# Patient Record
Sex: Female | Born: 1960 | Race: White | Hispanic: No | Marital: Married | Smoking: Never smoker
Health system: Southern US, Community
[De-identification: ages and names within clinical notes are randomized; demographics above are authoritative.]

## PROBLEM LIST (undated history)

## (undated) DIAGNOSIS — I639 Cerebral infarction, unspecified: Secondary | ICD-10-CM

## (undated) DIAGNOSIS — N898 Other specified noninflammatory disorders of vagina: Secondary | ICD-10-CM

## (undated) DIAGNOSIS — M1611 Unilateral primary osteoarthritis, right hip: Secondary | ICD-10-CM

## (undated) HISTORY — DX: Unilateral primary osteoarthritis, right hip: M16.11

## (undated) HISTORY — DX: Cerebral infarction, unspecified: I63.9

## (undated) HISTORY — DX: Other specified noninflammatory disorders of vagina: N89.8

## (undated) HISTORY — PX: NO PAST SURGERIES: SHX2092

---

## 2009-10-12 ENCOUNTER — Ambulatory Visit: Payer: Self-pay | Admitting: Obstetrics and Gynecology

## 2009-11-09 ENCOUNTER — Ambulatory Visit: Payer: Self-pay | Admitting: Obstetrics and Gynecology

## 2010-07-09 ENCOUNTER — Emergency Department: Payer: Self-pay | Admitting: Emergency Medicine

## 2011-03-29 ENCOUNTER — Ambulatory Visit: Payer: Self-pay | Admitting: Obstetrics and Gynecology

## 2012-06-12 ENCOUNTER — Ambulatory Visit: Payer: Self-pay | Admitting: Family Medicine

## 2012-07-31 ENCOUNTER — Ambulatory Visit: Payer: Self-pay | Admitting: Gastroenterology

## 2013-08-26 ENCOUNTER — Ambulatory Visit: Payer: Self-pay | Admitting: Oncology

## 2013-09-16 ENCOUNTER — Ambulatory Visit: Payer: Self-pay | Admitting: Oncology

## 2015-10-13 ENCOUNTER — Other Ambulatory Visit: Payer: Self-pay | Admitting: Obstetrics and Gynecology

## 2015-10-13 DIAGNOSIS — Z1231 Encounter for screening mammogram for malignant neoplasm of breast: Secondary | ICD-10-CM

## 2015-12-15 ENCOUNTER — Ambulatory Visit
Admission: RE | Admit: 2015-12-15 | Discharge: 2015-12-15 | Disposition: A | Discharge status: Home / Self Care | Source: Ambulatory Visit | Attending: Obstetrics and Gynecology | Admitting: Obstetrics and Gynecology

## 2015-12-15 DIAGNOSIS — Z1231 Encounter for screening mammogram for malignant neoplasm of breast: Secondary | ICD-10-CM | POA: Insufficient documentation

## 2016-12-22 ENCOUNTER — Ambulatory Visit (INDEPENDENT_AMBULATORY_CARE_PROVIDER_SITE_OTHER): Admitting: Obstetrics and Gynecology

## 2016-12-22 ENCOUNTER — Encounter: Payer: Self-pay | Admitting: Obstetrics and Gynecology

## 2016-12-22 VITALS — BP 124/74 | Ht 63.0 in | Wt 146.0 lb

## 2016-12-22 DIAGNOSIS — Z124 Encounter for screening for malignant neoplasm of cervix: Secondary | ICD-10-CM

## 2016-12-22 DIAGNOSIS — Z1389 Encounter for screening for other disorder: Secondary | ICD-10-CM | POA: Diagnosis not present

## 2016-12-22 DIAGNOSIS — Z01419 Encounter for gynecological examination (general) (routine) without abnormal findings: Secondary | ICD-10-CM | POA: Diagnosis not present

## 2016-12-22 DIAGNOSIS — N952 Postmenopausal atrophic vaginitis: Secondary | ICD-10-CM | POA: Diagnosis not present

## 2016-12-22 DIAGNOSIS — Z1331 Encounter for screening for depression: Secondary | ICD-10-CM

## 2016-12-22 DIAGNOSIS — Z1339 Encounter for screening examination for other mental health and behavioral disorders: Secondary | ICD-10-CM

## 2016-12-22 LAB — HM PAP SMEAR: HM Pap smear: NORMAL

## 2016-12-22 MED ORDER — ESTROGENS, CONJUGATED 0.625 MG/GM VA CREA: 0.5000 g | TOPICAL_CREAM | VAGINAL | 3 refills | Status: DC

## 2016-12-22 NOTE — Progress Notes (Signed)
Routine Annual Gynecology Examination   PCP: Cheryll Dessert, FNP  Chief Complaint:  Chief Complaint  Patient presents with  . Annual Exam    History of Present Illness: Patient is a 56 y.o. G1P1001 presents for annual exam. The patient has no complaints today.   Menopausal bleeding: denies  Menopausal symptoms: denies. She is doing very well with Premarin and would like to continue  Breast symptoms: denies  Last pap smear: 3 years ago.  Result Normal  Last mammogram: 1 years ago.  Result Normal  Past Medical History:  Diagnosis Date  . Stroke (HCC)   . Vaginal dryness    Past Surgical History: denies  Prior to Admission medications   Medication Sig Start Date End Date Taking? Authorizing Provider  cholecalciferol (VITAMIN D) 1000 units tablet Take 1,000 Units by mouth daily.   Yes [provider]  conjugated estrogens (PREMARIN) vaginal cream Place 1 Applicatorful vaginally daily.   Yes [provider]  vitamin B-12 (CYANOCOBALAMIN) 100 MCG tablet Take 100 mcg by mouth daily.   Yes [provider]    No Known Allergies  Gynecologic History:  No LMP recorded. Patient is postmenopausal. Contraception: none  Obstetric History: G1P1001  Social History   Social History  . Marital status: Married    Spouse name: N/A  . Number of children: N/A  . Years of education: N/A   Occupational History  . Not on file.   Social History Main Topics  . Smoking status: Never Smoker  . Smokeless tobacco: Never Used  . Alcohol use Yes  . Drug use: No  . Sexual activity: Not on file   Other Topics Concern  . Not on file   Social History Narrative  . No narrative on file    Family History  Problem Relation Age of Onset  . Colon cancer Father   . Hypertension Sister   . Hypertension Maternal Grandmother   . Liver cancer Maternal Grandmother     Review of Systems  Constitutional: Negative.   HENT: Negative.   Eyes: Negative.     Respiratory: Negative.   Cardiovascular: Negative.   Gastrointestinal: Negative.   Genitourinary: Negative.   Musculoskeletal: Negative.   Skin: Negative.   Neurological: Negative.   Psychiatric/Behavioral: Negative.      Physical Exam Vitals: BP 124/74   Ht  (1.6 m)   Wt 146 lb (66.2 kg)   BMI 25.86 kg/m   Physical Exam  Constitutional: She is oriented to person, place, and time. She appears well-developed and well-nourished. No distress.  Genitourinary: Pelvic exam was performed with patient supine. There is no rash, tenderness or lesion on the right labia. There is no rash, tenderness or lesion on the left labia.  HENT:  Head: Normocephalic and atraumatic.  Eyes: EOM are normal. No scleral icterus.  Neck: Normal range of motion. Neck supple. No thyromegaly present.  Cardiovascular: Normal rate and regular rhythm.   Pulmonary/Chest: Effort normal and breath sounds normal. No respiratory distress. She has no wheezes. She has no rales. Right breast exhibits no inverted nipple, no mass, no nipple discharge, no skin change and no tenderness. Left breast exhibits no inverted nipple, no mass, no nipple discharge, no skin change and no tenderness.  Abdominal: Soft. Bowel sounds are normal. She exhibits no distension and no mass. There is no tenderness. There is no rebound and no guarding.  Musculoskeletal: Normal range of motion. She exhibits no edema.  Lymphadenopathy:    She has no  cervical adenopathy.  Neurological: She is alert and oriented to person, place, and time. No cranial nerve deficit.  Skin: Skin is warm and dry. No erythema.  Psychiatric: She has a normal mood and affect. Her behavior is normal. Judgment normal.     Female chaperone present for pelvic and breast  portions of the physical exam  Results: AUDIT Questionnaire (screen for alcoholism): 1 PHQ-9: 0   Assessment and Plan:  56 y.o. 111P1001 female here for routine annual gynecologic  examination  Plan: Problem List Items Addressed This Visit    None    Visit Diagnoses    Women's annual routine gynecological examination    -  Primary   Screening for depression       Screening for alcohol problem       Pap smear for cervical cancer screening       Relevant Orders   IGP, Aptima HPV, rfx 16/18,45   Vaginal atrophy       Relevant Medications   conjugated estrogens (PREMARIN) vaginal cream      Screening: -- Blood pressure screen normal -- Colonoscopy - manged by PCP -- Mammogram - due. Patient to call [facility] to arrange. She understands that it is her responsibility to arrange this. -- Weight screening: normal -- Depression screening negative (PHQ-9) -- Nutrition: normal -- cholesterol screening: per PCP -- osteoporosis screening: not due -- tobacco screening: not using -- alcohol screening: AUDIT questionnaire indicates low-risk usage. -- family history of breast cancer screening: done. not at high risk. -- no evidence of domestic violence or intimate partner violence. -- STD screening: gonorrhea/chlamydia NAAT not collected per patient request. -- pap smear collected per ASCCP guidelines -- HPV vaccination series: not eligilbe   Thomasene MohairStephen Iylah Dworkin, MD 12/22/2016 11:16 AM

## 2016-12-26 LAB — IGP, APTIMA HPV, RFX 16/18,45
HPV Aptima: NEGATIVE
PAP Smear Comment: 0

## 2016-12-27 ENCOUNTER — Other Ambulatory Visit: Payer: Self-pay | Admitting: Obstetrics and Gynecology

## 2016-12-29 ENCOUNTER — Encounter: Payer: Self-pay | Admitting: Obstetrics and Gynecology

## 2017-01-03 ENCOUNTER — Other Ambulatory Visit: Payer: Self-pay | Admitting: Obstetrics and Gynecology

## 2017-01-03 DIAGNOSIS — Z1231 Encounter for screening mammogram for malignant neoplasm of breast: Secondary | ICD-10-CM

## 2017-02-02 ENCOUNTER — Telehealth: Payer: Self-pay | Admitting: Cardiovascular Disease

## 2017-02-02 DIAGNOSIS — E785 Hyperlipidemia, unspecified: Secondary | ICD-10-CM

## 2017-02-02 NOTE — Telephone Encounter (Signed)
Spoke with patient and she would like to have test done because she reports her cholesterol is 255. She wants to have this test done before considering to start any statins. Provided her with phone number to call, cost of test $150, and location. She was appreciative for the call and had no further questions or concerns at this time. Let her know that scheduling will give her a call to set up appointment with Dr. Mariah MillingGollan and that I would give her a call with results.

## 2017-02-03 NOTE — Telephone Encounter (Signed)
We can wait till scan is complete before booking an appointment If scan is normal with no buildup she may need a visit unless she would like to go over the pictures

## 2017-02-06 NOTE — Telephone Encounter (Signed)
Reviewed with patient that we will hold off on scheduling her appointment with Dr. Mariah MillingGollan and we would see what her results show. She was agreeable with this plan and had no further questions at this time.

## 2017-02-07 ENCOUNTER — Ambulatory Visit
Admission: RE | Admit: 2017-02-07 | Discharge: 2017-02-07 | Disposition: A | Discharge status: Home / Self Care | Source: Ambulatory Visit | Attending: Obstetrics and Gynecology | Admitting: Obstetrics and Gynecology

## 2017-02-07 DIAGNOSIS — Z1231 Encounter for screening mammogram for malignant neoplasm of breast: Secondary | ICD-10-CM | POA: Insufficient documentation

## 2017-12-13 ENCOUNTER — Other Ambulatory Visit: Payer: Self-pay | Admitting: Obstetrics and Gynecology

## 2017-12-13 DIAGNOSIS — N952 Postmenopausal atrophic vaginitis: Secondary | ICD-10-CM

## 2017-12-13 NOTE — Telephone Encounter (Signed)
Annual due after 12/22/17. Nothing scheduled yet. Please advise

## 2018-01-04 ENCOUNTER — Encounter: Payer: Self-pay | Admitting: Obstetrics and Gynecology

## 2018-01-04 ENCOUNTER — Ambulatory Visit (INDEPENDENT_AMBULATORY_CARE_PROVIDER_SITE_OTHER): Admitting: Obstetrics and Gynecology

## 2018-01-04 VITALS — BP 114/74 | Ht 63.0 in | Wt 135.0 lb

## 2018-01-04 DIAGNOSIS — Z1331 Encounter for screening for depression: Secondary | ICD-10-CM | POA: Diagnosis not present

## 2018-01-04 DIAGNOSIS — N952 Postmenopausal atrophic vaginitis: Secondary | ICD-10-CM

## 2018-01-04 DIAGNOSIS — Z01419 Encounter for gynecological examination (general) (routine) without abnormal findings: Secondary | ICD-10-CM

## 2018-01-04 DIAGNOSIS — Z1339 Encounter for screening examination for other mental health and behavioral disorders: Secondary | ICD-10-CM | POA: Diagnosis not present

## 2018-01-04 MED ORDER — ESTROGENS, CONJUGATED 0.625 MG/GM VA CREA: TOPICAL_CREAM | VAGINAL | 6 refills | Status: DC

## 2018-01-04 NOTE — Progress Notes (Signed)
Routine Annual Gynecology Examination   PCP: Cheryll Dessert, FNP  Chief Complaint  Patient presents with  . Annual Exam    History of Present Illness: Patient is a 57 y.o. G1P1001 presents for annual exam. The patient has no complaints today.   Menopausal bleeding: denies  Menopausal symptoms: denies  Breast symptoms: denies  Last pap smear: 1 year ago.  Result NILM, HPV negative  Last mammogram: 1 year ago.  Result BiRads 1   Last Colonoscopy: 5 years ago.  Is schedule for October this year.   Past Medical History:  Diagnosis Date  . Stroke (HCC)   . Vaginal dryness     Past Surgical History:  Procedure Laterality Date  . NO PAST SURGERIES      Prior to Admission medications   Medication Sig Start Date End Date Taking? Authorizing Provider  cholecalciferol (VITAMIN D) 1000 units tablet Take 1,000 Units by mouth daily.   Yes [provider]  PREMARIN vaginal cream PLACE ONE-FOURTH (1/4) APPLICATORFUL VAGINALLY TWICE WEEKLY 12/14/17  Yes Conard Novak, MD  vitamin B-12 (CYANOCOBALAMIN) 100 MCG tablet Take 100 mcg by mouth daily.   Yes [provider]   Allergies: No Known Allergies  Obstetric History: G1P1001  Social History   Socioeconomic History  . Marital status: Married    Spouse name: Not on file  . Number of children: Not on file  . Years of education: Not on file  . Highest education level: Not on file  Occupational History  . Not on file  Social Needs  . Financial resource strain: Not on file  . Food insecurity:    Worry: Not on file    Inability: Not on file  . Transportation needs:    Medical: Not on file    Non-medical: Not on file  Tobacco Use  . Smoking status: Never Smoker  . Smokeless tobacco: Never Used  Substance and Sexual Activity  . Alcohol use: Yes  . Drug use: No  . Sexual activity: Yes  Lifestyle  . Physical activity:    Days per week: Not on file    Minutes per session: Not on file  . Stress:  Not on file  Relationships  . Social connections:    Talks on phone: Not on file    Gets together: Not on file    Attends religious service: Not on file    Active member of club or organization: Not on file    Attends meetings of clubs or organizations: Not on file    Relationship status: Not on file  . Intimate partner violence:    Fear of current or ex partner: Not on file    Emotionally abused: Not on file    Physically abused: Not on file    Forced sexual activity: Not on file  Other Topics Concern  . Not on file  Social History Narrative  . Not on file    Family History  Problem Relation Age of Onset  . Colon cancer Father   . Hypertension Sister   . Hypertension Maternal Grandmother   . Liver cancer Maternal Grandmother   . Breast cancer Neg Hx     Review of Systems  Constitutional: Negative.   HENT: Negative.   Eyes: Negative.   Respiratory: Negative.   Cardiovascular: Negative.   Gastrointestinal: Negative.   Genitourinary: Negative.   Musculoskeletal: Negative.   Skin: Negative.   Neurological: Negative.   Psychiatric/Behavioral: Negative.      Physical Exam  Vitals: BP 114/74   Ht 5\' 3"  (1.6 m)   Wt 135 lb (61.2 kg)   BMI 23.91 kg/m   Physical Exam  Constitutional: She is oriented to person, place, and time. She appears well-developed and well-nourished. No distress.  Genitourinary: Uterus normal. Pelvic exam was performed with patient supine. There is no rash, tenderness, lesion or injury on the right labia. There is no rash, tenderness, lesion or injury on the left labia. No erythema, tenderness or bleeding in the vagina. No signs of injury around the vagina. No vaginal discharge found. Right adnexum does not display mass, does not display tenderness and does not display fullness. Left adnexum does not display mass, does not display tenderness and does not display fullness. Cervix does not exhibit motion tenderness, lesion, discharge or polyp.   Uterus  is mobile and anteverted. Uterus is not enlarged, tender or exhibiting a mass.  HENT:  Head: Normocephalic and atraumatic.  Eyes: EOM are normal. No scleral icterus.  Neck: Normal range of motion. Neck supple. No thyromegaly present.  Cardiovascular: Normal rate and regular rhythm. Exam reveals no gallop and no friction rub.  No murmur heard. Pulmonary/Chest: Effort normal and breath sounds normal. No respiratory distress. She has no wheezes. She has no rales. Right breast exhibits no inverted nipple, no mass, no nipple discharge, no skin change and no tenderness. Left breast exhibits no inverted nipple, no mass, no nipple discharge, no skin change and no tenderness.  Abdominal: Soft. Bowel sounds are normal. She exhibits no distension and no mass. There is no tenderness. There is no rebound and no guarding.  Musculoskeletal: Normal range of motion. She exhibits no edema or tenderness.  Lymphadenopathy:    She has no cervical adenopathy.       Right: No inguinal adenopathy present.       Left: No inguinal adenopathy present.  Neurological: She is alert and oriented to person, place, and time. No cranial nerve deficit.  Skin: Skin is warm and dry. No rash noted. No erythema.  Psychiatric: She has a normal mood and affect. Her behavior is normal. Judgment normal.     Female chaperone present for pelvic and breast  portions of the physical exam  Results: AUDIT Questionnaire (screen for alcoholism): 1 PHQ-9: 0   Assessment and Plan:  57 y.o. G64P1001 female here for routine annual gynecologic examination  Plan: Problem List Items Addressed This Visit    None    Visit Diagnoses    Women's annual routine gynecological examination    -  Primary   Relevant Medications   conjugated estrogens (PREMARIN) vaginal cream   Screening for depression       Screening for alcoholism       Vaginal atrophy       Relevant Medications   conjugated estrogens (PREMARIN) vaginal cream       Screening: -- Blood pressure screen normal -- Colonoscopy - due. Already schedule with GI for October this year -- Mammogram - due. Patient to call [facility] to arrange. She understands that it is her responsibility to arrange this. -- Weight screening: normal -- Depression screening negative (PHQ-9) -- Nutrition: normal -- cholesterol screening: per PCP -- osteoporosis screening: not due -- tobacco screening: not using -- alcohol screening: AUDIT questionnaire indicates low-risk usage. -- family history of breast cancer screening: done. not at high risk. -- no evidence of domestic violence or intimate partner violence. -- STD screening: gonorrhea/chlamydia NAAT not collected per patient request. -- pap smear not  collected per ASCCP guidelines -- flu vaccine plans to get -- HPV vaccination series: not eligilbe   Vaginal atrophy: Doing well. Refill Premarin.    Thomasene MohairStephen Elfrieda Espino, MD 01/04/2018 10:57 AM

## 2018-01-09 ENCOUNTER — Other Ambulatory Visit: Payer: Self-pay | Admitting: Obstetrics and Gynecology

## 2018-01-09 DIAGNOSIS — Z1231 Encounter for screening mammogram for malignant neoplasm of breast: Secondary | ICD-10-CM

## 2018-01-31 ENCOUNTER — Encounter: Payer: Self-pay | Admitting: Anesthesiology

## 2018-02-01 ENCOUNTER — Encounter
Admission: RE | Disposition: A | Discharge status: Home / Self Care | Payer: Self-pay | Source: Ambulatory Visit | Attending: Gastroenterology

## 2018-02-01 ENCOUNTER — Ambulatory Visit
Admission: RE | Admit: 2018-02-01 | Discharge: 2018-02-01 | Disposition: A | Discharge status: Home / Self Care | Source: Ambulatory Visit | Attending: Gastroenterology | Admitting: Gastroenterology

## 2018-02-01 ENCOUNTER — Ambulatory Visit: Admitting: Anesthesiology

## 2018-02-01 ENCOUNTER — Encounter: Payer: Self-pay | Admitting: *Deleted

## 2018-02-01 DIAGNOSIS — K5730 Diverticulosis of large intestine without perforation or abscess without bleeding: Secondary | ICD-10-CM | POA: Diagnosis not present

## 2018-02-01 DIAGNOSIS — Z800 Family history of malignant neoplasm of digestive organs: Secondary | ICD-10-CM | POA: Insufficient documentation

## 2018-02-01 DIAGNOSIS — Z1211 Encounter for screening for malignant neoplasm of colon: Secondary | ICD-10-CM | POA: Diagnosis not present

## 2018-02-01 DIAGNOSIS — Z79899 Other long term (current) drug therapy: Secondary | ICD-10-CM | POA: Insufficient documentation

## 2018-02-01 DIAGNOSIS — Z8673 Personal history of transient ischemic attack (TIA), and cerebral infarction without residual deficits: Secondary | ICD-10-CM | POA: Diagnosis not present

## 2018-02-01 HISTORY — PX: COLONOSCOPY WITH PROPOFOL: SHX5780

## 2018-02-01 SURGERY — COLONOSCOPY WITH PROPOFOL
Anesthesia: General

## 2018-02-01 MED ORDER — MIDAZOLAM HCL 5 MG/ML IJ SOLN
INTRAMUSCULAR | Status: DC | PRN
  Administered 2018-02-01: 1 mg via INTRAVENOUS

## 2018-02-01 MED ORDER — SODIUM CHLORIDE 0.9 % IV SOLN
INTRAVENOUS | Status: DC
  Administered 2018-02-01: 1000 mL via INTRAVENOUS

## 2018-02-01 MED ORDER — PROPOFOL 10 MG/ML IV BOLUS
INTRAVENOUS | Status: DC | PRN
  Administered 2018-02-01 (×2): 50 mg via INTRAVENOUS

## 2018-02-01 MED ORDER — FENTANYL CITRATE (PF) 250 MCG/5ML IJ SOLN
INTRAMUSCULAR | Status: DC | PRN
  Administered 2018-02-01: 50 ug via INTRAVENOUS

## 2018-02-01 MED ORDER — PROPOFOL 500 MG/50ML IV EMUL
INTRAVENOUS | Status: DC | PRN
  Administered 2018-02-01: 120 ug/kg/min via INTRAVENOUS

## 2018-02-01 NOTE — Op Note (Addendum)
[facility] Patient Name: Courtney Hawkins Procedure Date: 02/01/2018 7:57 AM MRN: [id number] Account #: [id number] Date of Birth: [birth year] Admit Type: Outpatient Age: 57 Room: [facility] ENDO ROOM 1 Gender: Female Note Status: Finalized Procedure:            Colonoscopy Indications:          Family history of colon cancer in a first-degree                        relative Providers:            Christena Deem, MD Referring MD:         No Local Md, MD (Referring MD) Medicines:            Monitored Anesthesia Care Complications:        No immediate complications. Procedure:            Pre-Anesthesia Assessment:                       - ASA Grade Assessment: III - A patient with severe                        systemic disease.                       After obtaining informed consent, the colonoscope was                        passed under direct vision. Throughout the procedure,                        the patient's blood pressure, pulse, and oxygen                        saturations were monitored continuously. The                        Colonoscope was introduced through the anus and                        advanced to the the cecum, identified by appendiceal                        orifice and ileocecal valve. The colonoscopy was                        performed without difficulty. The patient tolerated the                        procedure well. The quality of the bowel preparation                        was fair. Findings:      Multiple medium-mouthed diverticula were found in the sigmoid colon and       descending colon.      The retroflexed view of the distal rectum and anal verge was normal and       showed no anal or rectal abnormalities.      The exam was otherwise without abnormality. Impression:           - Preparation of the colon was fair.                       -  Diverticulosis in the sigmoid colon and in the                        descending  colon.                       - The distal rectum and anal verge are normal on                        retroflexion view.                       - The examination was otherwise normal.                       - No specimens collected. Recommendation:       - Discharge patient to home.                       - Advance diet as tolerated.                       - Repeat colonoscopy in 5 years for screening purposes. Procedure Code(s):    --- Professional ---                       [phone], Colonoscopy, flexible; diagnostic, including                        collection of specimen(s) by brushing or washing, when                        performed (separate procedure) Diagnosis Code(s):    --- Professional ---                       Z80.0, Family history of malignant neoplasm of                        digestive organs                       K57.30, Diverticulosis of large intestine without                        perforation or abscess without bleeding CPT copyright 2018 American Medical Association. All rights reserved. The codes documented in this report are preliminary and upon coder review may  be revised to meet current compliance requirements. Christena Deem, MD 02/01/2018 8:26:50 AM This report has been signed electronically. Number of Addenda: 0 Note Initiated On: 02/01/2018 7:57 AM Scope Withdrawal Time: 0 hours 9 minutes 8 seconds  Total Procedure Duration: 0 hours 18 minutes 42 seconds       [facility], [place]

## 2018-02-01 NOTE — Transfer of Care (Signed)
Immediate Anesthesia Transfer of Care Note  Patient: Courtney Hawkins  Procedure(s) Performed: COLONOSCOPY WITH PROPOFOL (N/A )  Patient Location: PACU and Endoscopy Unit  Anesthesia Type:General  Level of Consciousness: awake, alert  and oriented  Airway & Oxygen Therapy: Patient Spontanous Breathing  Post-op Assessment: Report given to RN and Post -op Vital signs reviewed and stable  Post vital signs: Reviewed and stable  Last Vitals:  Vitals Value Taken Time  BP    Temp 35.7 C 02/01/2018  8:20 AM  Pulse 82 02/01/2018  8:20 AM  Resp 18 02/01/2018  8:20 AM  SpO2 100 % 02/01/2018  8:20 AM    Last Pain:  Vitals:   02/01/18 0820  TempSrc: Tympanic  PainSc:          Complications: No apparent anesthesia complications

## 2018-02-01 NOTE — Anesthesia Preprocedure Evaluation (Signed)
Anesthesia Evaluation  Patient identified by MRN, date of birth, ID band Patient awake    Reviewed: Allergy & Precautions, NPO status , Patient's Chart, lab work & pertinent test results, reviewed documented beta blocker date and time   Airway Mallampati: II  TM Distance: >3 FB     Dental  (+) Chipped   Pulmonary           Cardiovascular      Neuro/Psych CVA, No Residual Symptoms    GI/Hepatic   Endo/Other    Renal/GU      Musculoskeletal   Abdominal   Peds  Hematology   Anesthesia Other Findings   Reproductive/Obstetrics                             Anesthesia Physical Anesthesia Plan  ASA: III  Anesthesia Plan: General   Post-op Pain Management:    Induction: Intravenous  PONV Risk Score and Plan:   Airway Management Planned:   Additional Equipment:   Intra-op Plan:   Post-operative Plan:   Informed Consent: I have reviewed the patients History and Physical, chart, labs and discussed the procedure including the risks, benefits and alternatives for the proposed anesthesia with the patient or authorized representative who has indicated his/her understanding and acceptance.     Plan Discussed with: CRNA  Anesthesia Plan Comments:         Anesthesia Quick Evaluation

## 2018-02-01 NOTE — H&P (Signed)
Outpatient short stay form Pre-procedure 02/01/2018 7:49 AM Christena Deem MD  Primary Physician: Cheryll Dessert, PA  Reason for visit: Patient is a 57 year old female presenting today for colonoscopy.  History of present illness:    Her last colonoscopy was 07/31/2012.  Has a family history of colon cancer in primary relative, father.  sHe denies use of any aspirin products or blood thinning agent.  She tolerated her prep well.     Current Facility-Administered Medications:  .  0.9 %  sodium chloride infusion, , Intravenous, Continuous, Christena Deem, MD, Last Rate: 20 mL/hr at 02/01/18 0741, 1,000 mL at 02/01/18 0741  Medications Prior to Admission  Medication Sig Dispense Refill Last Dose  . cholecalciferol (VITAMIN D) 1000 units tablet Take 1,000 Units by mouth daily.   Past Week at Unknown time  . conjugated estrogens (PREMARIN) vaginal cream PLACE ONE-FOURTH (1/4) APPLICATORFUL VAGINALLY TWICE WEEKLY 30 g 6 Past Week at Unknown time  . vitamin B-12 (CYANOCOBALAMIN) 100 MCG tablet Take 100 mcg by mouth daily.   Past Week at Unknown time     No Known Allergies   Past Medical History:  Diagnosis Date  . Stroke (HCC)   . Vaginal dryness     Review of systems:      Physical Exam    Heart and lungs: Regular rate and rhythm without rub or gallop, lungs are bilaterally clear.    HEENT: Normocephalic atraumatic eyes are anicteric    Other:    Pertinant exam for procedure: Soft nontender nondistended bowel sounds positive normoactive.    Planned proceedures: Colonoscopy and indicated procedures. I have discussed the risks benefits and complications of procedures to include not limited to bleeding, infection, perforation and the risk of sedation and the patient wishes to proceed.    Christena Deem, MD Gastroenterology 02/01/2018  7:49 AM

## 2018-02-01 NOTE — Anesthesia Post-op Follow-up Note (Signed)
Anesthesia QCDR form completed.        

## 2018-02-01 NOTE — Anesthesia Postprocedure Evaluation (Signed)
Anesthesia Post Note  Patient: Courtney Hawkins  Procedure(s) Performed: COLONOSCOPY WITH PROPOFOL (N/A )  Patient location during evaluation: Endoscopy Anesthesia Type: General Level of consciousness: awake and alert Pain management: pain level controlled Vital Signs Assessment: post-procedure vital signs reviewed and stable Respiratory status: spontaneous breathing, nonlabored ventilation, respiratory function stable and patient connected to nasal cannula oxygen Cardiovascular status: blood pressure returned to baseline and stable Postop Assessment: no apparent nausea or vomiting Anesthetic complications: no     Last Vitals:  Vitals:   02/01/18 0723 02/01/18 0820  BP: 117/76 (!) 111/52  Pulse: 67 82  Resp: 18 18  Temp: (!) 35.9 C (!) 35.7 C  SpO2: 100% 100%    Last Pain:  Vitals:   02/01/18 0820  TempSrc: Tympanic  PainSc:                  Marcee Jacobs S

## 2018-02-08 ENCOUNTER — Ambulatory Visit
Admission: RE | Admit: 2018-02-08 | Discharge: 2018-02-08 | Disposition: A | Discharge status: Home / Self Care | Source: Ambulatory Visit | Attending: Obstetrics and Gynecology | Admitting: Obstetrics and Gynecology

## 2018-02-08 DIAGNOSIS — Z1231 Encounter for screening mammogram for malignant neoplasm of breast: Secondary | ICD-10-CM

## 2018-07-09 ENCOUNTER — Other Ambulatory Visit: Payer: Self-pay | Admitting: Obstetrics and Gynecology

## 2018-07-09 DIAGNOSIS — Z01419 Encounter for gynecological examination (general) (routine) without abnormal findings: Secondary | ICD-10-CM

## 2018-07-09 DIAGNOSIS — N952 Postmenopausal atrophic vaginitis: Secondary | ICD-10-CM

## 2019-01-08 ENCOUNTER — Other Ambulatory Visit: Payer: Self-pay

## 2019-01-08 ENCOUNTER — Ambulatory Visit (INDEPENDENT_AMBULATORY_CARE_PROVIDER_SITE_OTHER): Admitting: Obstetrics and Gynecology

## 2019-01-08 ENCOUNTER — Encounter: Payer: Self-pay | Admitting: Obstetrics and Gynecology

## 2019-01-08 VITALS — BP 124/74 | Ht 63.0 in | Wt 143.0 lb

## 2019-01-08 DIAGNOSIS — Z01419 Encounter for gynecological examination (general) (routine) without abnormal findings: Secondary | ICD-10-CM

## 2019-01-08 DIAGNOSIS — Z1339 Encounter for screening examination for other mental health and behavioral disorders: Secondary | ICD-10-CM

## 2019-01-08 DIAGNOSIS — Z1331 Encounter for screening for depression: Secondary | ICD-10-CM

## 2019-01-08 DIAGNOSIS — N952 Postmenopausal atrophic vaginitis: Secondary | ICD-10-CM

## 2019-01-08 MED ORDER — PREMARIN 0.625 MG/GM VA CREA: TOPICAL_CREAM | VAGINAL | 3 refills | Status: DC

## 2019-01-08 NOTE — Progress Notes (Signed)
Routine Annual Gynecology Examination   PCP: Olena Leatherwood, FNP  Chief Complaint  Patient presents with  . Annual Exam    History of Present Illness: Patient is a 57 y.o. G1P1001 presents for annual exam. The patient has no complaints today.   Menopausal bleeding: denies  Menopausal symptoms: vaginal dryness, not as bad as before. Has history of stroke, but was a rupture of blood vessels.   Breast symptoms: denies  Last pap smear: 2 years ago.  Result Normal  Last mammogram: 11 months ago.  Result Normal   Last Colonoscopy: diverticulosis of sigmoid and descending colon, otherwise normal. Follow up 5 years for screening (2019 was last)  Past Medical History:  Diagnosis Date  . Stroke (HCC)   . Vaginal dryness    Past Surgical History:  Procedure Laterality Date  . COLONOSCOPY WITH PROPOFOL N/A 02/01/2018   Procedure: COLONOSCOPY WITH PROPOFOL;  Surgeon: Christena Deem, MD;  Location: [facility] ENDOSCOPY;  Service: Endoscopy;  Laterality: N/A;  . NO PAST SURGERIES      Prior to Admission medications   Medication Sig Start Date End Date Taking? Authorizing Provider  cholecalciferol (VITAMIN D) 1000 units tablet Take 1,000 Units by mouth daily.   Yes [provider]  conjugated estrogens (PREMARIN) vaginal cream PLACE ONE-FOURTH (1/4) APPLICATORFUL VAGINALLY TWICE WEEKLY 07/10/18  Yes Conard Novak, MD  Turmeric (QC TUMERIC COMPLEX PO) Take by mouth.   Yes [provider]  vitamin B-12 (CYANOCOBALAMIN) 100 MCG tablet Take 100 mcg by mouth daily.   Yes [provider]   Allergies: No Known Allergies  Obstetric History: G1P1001  Social History   Socioeconomic History  . Marital status: Married    Spouse name: Not on file  . Number of children: Not on file  . Years of education: Not on file  . Highest education level: Not on file  Occupational History  . Not on file  Social Needs  . Financial resource strain: Not on file  .  Food insecurity    Worry: Not on file    Inability: Not on file  . Transportation needs    Medical: Not on file    Non-medical: Not on file  Tobacco Use  . Smoking status: Never Smoker  . Smokeless tobacco: Never Used  Substance and Sexual Activity  . Alcohol use: Yes  . Drug use: Never  . Sexual activity: Yes  Lifestyle  . Physical activity    Days per week: Not on file    Minutes per session: Not on file  . Stress: Not on file  Relationships  . Social Musician on phone: Not on file    Gets together: Not on file    Attends religious service: Not on file    Active member of club or organization: Not on file    Attends meetings of clubs or organizations: Not on file    Relationship status: Not on file  . Intimate partner violence    Fear of current or ex partner: Not on file    Emotionally abused: Not on file    Physically abused: Not on file    Forced sexual activity: Not on file  Other Topics Concern  . Not on file  Social History Narrative  . Not on file    Family History  Problem Relation Age of Onset  . Colon cancer Father   . Hypertension Sister   . Hypertension Maternal Grandmother   . Liver  cancer Maternal Grandmother   . Breast cancer Neg Hx     Review of Systems  Constitutional: Negative.   HENT: Negative.   Eyes: Negative.   Respiratory: Negative.   Cardiovascular: Negative.   Gastrointestinal: Negative.   Genitourinary: Negative.   Musculoskeletal: Negative.   Skin: Negative.   Neurological: Negative.   Psychiatric/Behavioral: Negative.      Physical Exam Vitals: BP 124/74   Ht 5\' 3"  (1.6 m)   Wt 143 lb (64.9 kg)   BMI 25.33 kg/m   Physical Exam Constitutional:      General: She is not in acute distress.    Appearance: Normal appearance. She is well-developed.  Genitourinary:     Pelvic exam was performed with patient supine.     Vulva, urethra, bladder and uterus normal.     No inguinal adenopathy present in the right or  left side.    No signs of injury in the vagina.     No vaginal discharge, erythema, tenderness or bleeding.     No cervical motion tenderness, discharge, lesion or polyp.     Uterus is mobile.     Uterus is not enlarged or tender.     No uterine mass detected.    Uterus is anteverted.     No right or left adnexal mass present.     Right adnexa not tender or full.     Left adnexa not tender or full.  HENT:     Head: Normocephalic and atraumatic.  Eyes:     General: No scleral icterus.    Conjunctiva/sclera: Conjunctivae normal.  Neck:     Musculoskeletal: Normal range of motion and neck supple.     Thyroid: No thyromegaly.  Cardiovascular:     Rate and Rhythm: Normal rate and regular rhythm.     Heart sounds: No murmur. No friction rub. No gallop.   Pulmonary:     Effort: Pulmonary effort is normal. No respiratory distress.     Breath sounds: Normal breath sounds. No wheezing or rales.  Chest:     Breasts:        Right: No inverted nipple, mass, nipple discharge, skin change or tenderness.        Left: No inverted nipple, mass, nipple discharge, skin change or tenderness.  Abdominal:     General: Bowel sounds are normal. There is no distension.     Palpations: Abdomen is soft. There is no mass.     Tenderness: There is no abdominal tenderness. There is no guarding or rebound.  Musculoskeletal: Normal range of motion.        General: No swelling or tenderness.  Lymphadenopathy:     Cervical: No cervical adenopathy.     Lower Body: No right inguinal adenopathy. No left inguinal adenopathy.  Neurological:     General: No focal deficit present.     Mental Status: She is alert and oriented to person, place, and time.     Cranial Nerves: No cranial nerve deficit.  Skin:    General: Skin is warm and dry.     Findings: No erythema or rash.  Psychiatric:        Mood and Affect: Mood normal.        Behavior: Behavior normal.        Judgment: Judgment normal.    Female chaperone  present for pelvic and breast  portions of the physical exam  Results: AUDIT Questionnaire (screen for alcoholism): 0 PHQ-9: 0  Assessment and Plan:  58 y.o. G29P1001 female here for routine annual gynecologic examination  Plan: Problem List Items Addressed This Visit    None    Visit Diagnoses    Women's annual routine gynecological examination    -  Primary   Relevant Medications   conjugated estrogens (PREMARIN) vaginal cream   Screening for depression       Screening for alcoholism       Vaginal atrophy       Relevant Medications   conjugated estrogens (PREMARIN) vaginal cream     Screening: -- Blood pressure screen normal -- Colonoscopy - not due -- Mammogram - due. Patient to call [facility] to arrange. She understands that it is her responsibility to arrange this. -- Weight screening: normal -- Depression screening negative (PHQ-9) -- Nutrition: normal -- cholesterol screening: per PCP -- osteoporosis screening: not due -- tobacco screening: not using -- alcohol screening: AUDIT questionnaire indicates low-risk usage. -- family history of breast cancer screening: done. not at high risk. -- no evidence of domestic violence or intimate partner violence. -- STD screening: gonorrhea/chlamydia NAAT not collected per patient request. -- pap smear not collected per ASCCP guidelines -- flu vaccine plans to get at [facility] in [place].  -- HPV vaccination series: not eligilbe   Prentice Docker, MD 01/08/2019 11:00 AM

## 2019-01-08 NOTE — Patient Instructions (Signed)
Consider Intrarosa instead of vaginal estrogen.

## 2019-01-10 ENCOUNTER — Other Ambulatory Visit: Payer: Self-pay | Admitting: Nurse Practitioner

## 2019-01-10 DIAGNOSIS — Z1231 Encounter for screening mammogram for malignant neoplasm of breast: Secondary | ICD-10-CM

## 2019-02-12 ENCOUNTER — Ambulatory Visit
Admission: RE | Admit: 2019-02-12 | Discharge: 2019-02-12 | Disposition: A | Discharge status: Home / Self Care | Source: Ambulatory Visit | Attending: Nurse Practitioner | Admitting: Nurse Practitioner

## 2019-02-12 ENCOUNTER — Other Ambulatory Visit: Payer: Self-pay

## 2019-02-12 DIAGNOSIS — Z1231 Encounter for screening mammogram for malignant neoplasm of breast: Secondary | ICD-10-CM | POA: Diagnosis not present

## 2019-08-05 ENCOUNTER — Other Ambulatory Visit: Payer: Self-pay | Admitting: Obstetrics and Gynecology

## 2019-08-05 DIAGNOSIS — N952 Postmenopausal atrophic vaginitis: Secondary | ICD-10-CM

## 2019-08-05 DIAGNOSIS — Z01419 Encounter for gynecological examination (general) (routine) without abnormal findings: Secondary | ICD-10-CM

## 2019-08-06 NOTE — Telephone Encounter (Signed)
Advise

## 2019-08-08 ENCOUNTER — Other Ambulatory Visit: Payer: Self-pay | Admitting: Obstetrics and Gynecology

## 2019-08-08 DIAGNOSIS — N952 Postmenopausal atrophic vaginitis: Secondary | ICD-10-CM

## 2019-08-08 MED ORDER — ESTRADIOL 0.1 MG/GM VA CREA: 1.0000 | TOPICAL_CREAM | VAGINAL | 3 refills | Status: DC

## 2020-01-16 ENCOUNTER — Other Ambulatory Visit: Payer: Self-pay

## 2020-01-16 ENCOUNTER — Other Ambulatory Visit (HOSPITAL_COMMUNITY)
Admission: RE | Admit: 2020-01-16 | Discharge: 2020-01-16 | Disposition: A | Discharge status: Home / Self Care | Source: Ambulatory Visit | Attending: Obstetrics and Gynecology | Admitting: Obstetrics and Gynecology

## 2020-01-16 ENCOUNTER — Encounter: Payer: Self-pay | Admitting: Obstetrics and Gynecology

## 2020-01-16 ENCOUNTER — Ambulatory Visit (INDEPENDENT_AMBULATORY_CARE_PROVIDER_SITE_OTHER): Admitting: Obstetrics and Gynecology

## 2020-01-16 VITALS — BP 129/77 | HR 81 | Ht 63.0 in | Wt 144.0 lb

## 2020-01-16 DIAGNOSIS — Z124 Encounter for screening for malignant neoplasm of cervix: Secondary | ICD-10-CM | POA: Insufficient documentation

## 2020-01-16 DIAGNOSIS — Z1331 Encounter for screening for depression: Secondary | ICD-10-CM | POA: Diagnosis not present

## 2020-01-16 DIAGNOSIS — N952 Postmenopausal atrophic vaginitis: Secondary | ICD-10-CM

## 2020-01-16 DIAGNOSIS — Z01419 Encounter for gynecological examination (general) (routine) without abnormal findings: Secondary | ICD-10-CM

## 2020-01-16 DIAGNOSIS — Z1339 Encounter for screening examination for other mental health and behavioral disorders: Secondary | ICD-10-CM

## 2020-01-16 MED ORDER — ESTRADIOL 0.1 MG/GM VA CREA: 1.0000 | TOPICAL_CREAM | VAGINAL | 4 refills | Status: DC

## 2020-01-16 NOTE — Progress Notes (Addendum)
Routine Annual Gynecology Examination   PCP: Olena Leatherwood, FNP  Chief Complaint  Patient presents with  . Gynecologic Exam   History of Present Illness: Patient is a 59 y.o. G1P1001 presents for annual exam. The patient has no complaints today.   Menopausal bleeding: denies  Menopausal symptoms: vaginal dryness, not as bad as before. Has history of stroke, but was a rupture of blood vessels.   Breast symptoms: denies  Last pap smear: 3 years ago.  Result Normal  Last mammogram: 1 year ago.  Result Normal   Last Colonoscopy: diverticulosis of sigmoid and descending colon, otherwise normal. Follow up 5 years for screening (2019 was last)  Past Medical History:  Diagnosis Date  . Osteoarthritis of one hip, right   . Stroke (HCC)   . Vaginal dryness    Past Surgical History:  Procedure Laterality Date  . COLONOSCOPY WITH PROPOFOL N/A 02/01/2018   Procedure: COLONOSCOPY WITH PROPOFOL;  Surgeon: Christena Deem, MD;  Location: [facility] ENDOSCOPY;  Service: Endoscopy;  Laterality: N/A;  . NO PAST SURGERIES      Prior to Admission medications   Medication Sig Start Date End Date Taking? Authorizing Provider  cholecalciferol (VITAMIN D) 1000 units tablet Take 1,000 Units by mouth daily.   Yes [provider]  conjugated estrogens (PREMARIN) vaginal cream PLACE ONE-FOURTH (1/4) APPLICATORFUL VAGINALLY TWICE WEEKLY 07/10/18  Yes Conard Novak, MD  Turmeric (QC TUMERIC COMPLEX PO) Take by mouth.   Yes [provider]  vitamin B-12 (CYANOCOBALAMIN) 100 MCG tablet Take 100 mcg by mouth daily.   Yes [provider]   Allergies: No Known Allergies  Obstetric History: G1P1001  Social History   Socioeconomic History  . Marital status: Married    Spouse name: Not on file  . Number of children: Not on file  . Years of education: Not on file  . Highest education level: Not on file  Occupational History  . Not on file  Tobacco Use  . Smoking  status: Never Smoker  . Smokeless tobacco: Never Used  Vaping Use  . Vaping Use: Never used  Substance and Sexual Activity  . Alcohol use: Yes  . Drug use: Never  . Sexual activity: Yes  Other Topics Concern  . Not on file  Social History Narrative  . Not on file   Social Determinants of Health   Financial Resource Strain:   . Difficulty of Paying Living Expenses: Not on file  Food Insecurity:   . Worried About Programme researcher, broadcasting/film/video in the Last Year: Not on file  . Ran Out of Food in the Last Year: Not on file  Transportation Needs:   . Lack of Transportation (Medical): Not on file  . Lack of Transportation (Non-Medical): Not on file  Physical Activity:   . Days of Exercise per Week: Not on file  . Minutes of Exercise per Session: Not on file  Stress:   . Feeling of Stress : Not on file  Social Connections:   . Frequency of Communication with Friends and Family: Not on file  . Frequency of Social Gatherings with Friends and Family: Not on file  . Attends Religious Services: Not on file  . Active Member of Clubs or Organizations: Not on file  . Attends Banker Meetings: Not on file  . Marital Status: Not on file  Intimate Partner Violence:   . Fear of Current or Ex-Partner: Not on file  . Emotionally Abused: Not on  file  . Physically Abused: Not on file  . Sexually Abused: Not on file    Family History  Problem Relation Age of Onset  . Colon cancer Father   . Hypertension Sister   . Hypertension Maternal Grandmother   . Liver cancer Maternal Grandmother   . Breast cancer Neg Hx     Review of Systems  Constitutional: Negative.   HENT: Negative.   Eyes: Negative.   Respiratory: Negative.   Cardiovascular: Negative.   Gastrointestinal: Negative.   Genitourinary: Negative.   Musculoskeletal: Negative.   Skin: Negative.   Neurological: Negative.   Psychiatric/Behavioral: Negative.      Physical Exam Vitals: BP 129/77   Pulse 81   Ht 5\' 3"  (1.6  m)   Wt 144 lb (65.3 kg)   BMI 25.51 kg/m   Physical Exam Constitutional:      General: She is not in acute distress.    Appearance: Normal appearance. She is well-developed.  Genitourinary:     Pelvic exam was performed with patient supine.     Vulva, urethra, bladder and uterus normal.     No signs of injury in the vagina.     No vaginal discharge, erythema, tenderness or bleeding.     No cervical motion tenderness, discharge, lesion or polyp.     Uterus is mobile.     Uterus is not enlarged or tender.     No uterine mass detected.    Uterus is anteverted.     No right or left adnexal mass present.     Right adnexa not tender or full.     Left adnexa not tender or full.  HENT:     Head: Normocephalic and atraumatic.  Eyes:     General: No scleral icterus.    Conjunctiva/sclera: Conjunctivae normal.  Neck:     Thyroid: No thyromegaly.  Cardiovascular:     Rate and Rhythm: Normal rate and regular rhythm.     Heart sounds: No murmur heard.  No friction rub. No gallop.   Pulmonary:     Effort: Pulmonary effort is normal. No respiratory distress.     Breath sounds: Normal breath sounds. No wheezing or rales.  Chest:     Breasts:        Right: No inverted nipple, mass, nipple discharge, skin change or tenderness.        Left: No inverted nipple, mass, nipple discharge, skin change or tenderness.  Abdominal:     General: Bowel sounds are normal. There is no distension.     Palpations: Abdomen is soft. There is no mass.     Tenderness: There is no abdominal tenderness. There is no guarding or rebound.  Musculoskeletal:        General: No swelling or tenderness. Normal range of motion.     Cervical back: Normal range of motion and neck supple.  Lymphadenopathy:     Cervical: No cervical adenopathy.     Lower Body: No right inguinal adenopathy. No left inguinal adenopathy.  Neurological:     General: No focal deficit present.     Mental Status: She is alert and oriented to  person, place, and time.     Cranial Nerves: No cranial nerve deficit.  Skin:    General: Skin is warm and dry.     Findings: No erythema or rash.  Psychiatric:        Mood and Affect: Mood normal.        Behavior: Behavior normal.  Judgment: Judgment normal.    Female chaperone present for pelvic and breast  portions of the physical exam  Results: AUDIT Questionnaire (screen for alcoholism): 0 PHQ-9: 0  Assessment and Plan:  59 y.o. G77P1001 female here for routine annual gynecologic examination  Plan: Problem List Items Addressed This Visit    None    Visit Diagnoses    Women's annual routine gynecological examination    -  Primary   Relevant Medications   estradiol (ESTRACE VAGINAL) 0.1 MG/GM vaginal cream   Other Relevant Orders   Cytology - PAP   Screening for depression       Screening for alcoholism       Vaginal atrophy       Relevant Medications   estradiol (ESTRACE VAGINAL) 0.1 MG/GM vaginal cream   Pap smear for cervical cancer screening       Relevant Orders   Cytology - PAP     Screening: -- Blood pressure screen normal -- Colonoscopy - not due -- Mammogram - due. Patient to call [facility] to arrange. She understands that it is her responsibility to arrange this. -- Weight screening: normal -- Depression screening negative (PHQ-9) -- Nutrition: normal -- cholesterol screening: per PCP -- osteoporosis screening: not due -- tobacco screening: not using -- alcohol screening: AUDIT questionnaire indicates low-risk usage. -- family history of breast cancer screening: done. not at high risk. -- no evidence of domestic violence or intimate partner violence. -- STD screening: gonorrhea/chlamydia NAAT not collected per patient request. -- pap smear not collected per ASCCP guidelines -- flu vaccine plans to get at [facility] in [place].  -- HPV vaccination series: not eligilbe   -- Has not received COVID19 vaccine  Thomasene Mohair, MD 01/16/2020 11:15 AM

## 2020-01-16 NOTE — Addendum Note (Signed)
Addended by: Thomasene Mohair D on: 01/16/2020 05:18 PM   Modules accepted: Orders

## 2020-01-17 LAB — CYTOLOGY - PAP
Comment: NEGATIVE
Diagnosis: NEGATIVE
High risk HPV: NEGATIVE

## 2020-01-21 ENCOUNTER — Other Ambulatory Visit: Payer: Self-pay | Admitting: Obstetrics and Gynecology

## 2020-01-21 DIAGNOSIS — Z1231 Encounter for screening mammogram for malignant neoplasm of breast: Secondary | ICD-10-CM

## 2020-02-18 ENCOUNTER — Ambulatory Visit

## 2020-02-28 ENCOUNTER — Other Ambulatory Visit: Payer: Self-pay

## 2020-02-28 ENCOUNTER — Ambulatory Visit
Admission: RE | Admit: 2020-02-28 | Discharge: 2020-02-28 | Disposition: A | Discharge status: Home / Self Care | Source: Ambulatory Visit | Attending: Obstetrics and Gynecology | Admitting: Obstetrics and Gynecology

## 2020-02-28 DIAGNOSIS — Z1231 Encounter for screening mammogram for malignant neoplasm of breast: Secondary | ICD-10-CM | POA: Diagnosis not present

## 2021-02-04 ENCOUNTER — Ambulatory Visit: Payer: PRIVATE HEALTH INSURANCE | Admitting: Obstetrics and Gynecology

## 2021-02-16 ENCOUNTER — Ambulatory Visit: Payer: PRIVATE HEALTH INSURANCE | Admitting: Obstetrics and Gynecology

## 2021-03-02 ENCOUNTER — Ambulatory Visit (INDEPENDENT_AMBULATORY_CARE_PROVIDER_SITE_OTHER): Admitting: Obstetrics and Gynecology

## 2021-03-02 ENCOUNTER — Other Ambulatory Visit: Payer: Self-pay

## 2021-03-02 ENCOUNTER — Encounter: Payer: Self-pay | Admitting: Obstetrics and Gynecology

## 2021-03-02 VITALS — BP 126/80 | Ht 63.0 in | Wt 142.0 lb

## 2021-03-02 DIAGNOSIS — Z1331 Encounter for screening for depression: Secondary | ICD-10-CM | POA: Diagnosis not present

## 2021-03-02 DIAGNOSIS — Z01419 Encounter for gynecological examination (general) (routine) without abnormal findings: Secondary | ICD-10-CM

## 2021-03-02 DIAGNOSIS — Z1339 Encounter for screening examination for other mental health and behavioral disorders: Secondary | ICD-10-CM

## 2021-03-02 NOTE — Progress Notes (Signed)
Routine Annual Gynecology Examination   PCP: Olena Leatherwood, FNP  Chief Complaint  Patient presents with   Annual Exam   History of Present Illness: Patient is a 60 y.o. G1P1001 presents for annual exam. The patient has no complaints today.   Menopausal bleeding: denies  Menopausal symptoms:  vaginal dryness, not as bad as before . Has history of stroke, but was a rupture of blood vessels.   Breast symptoms: denies  Last pap smear: 1 year ago.  Result Normal  Last mammogram: 1 year ago.  Result Normal   Last Colonoscopy: diverticulosis of sigmoid and descending colon, otherwise normal. Follow up 5 years for screening (2019 was last)  Past Medical History:  Diagnosis Date   Osteoarthritis of one hip, right    Stroke [facility])    Vaginal dryness    Past Surgical History:  Procedure Laterality Date   COLONOSCOPY WITH PROPOFOL N/A 02/01/2018   Procedure: COLONOSCOPY WITH PROPOFOL;  Surgeon: Christena Deem, MD;  Location: [facility] ENDOSCOPY;  Service: Endoscopy;  Laterality: N/A;   NO PAST SURGERIES      Prior to Admission medications   Medication Sig Start Date End Date Taking? Authorizing Provider  cholecalciferol (VITAMIN D) 1000 units tablet Take 1,000 Units by mouth daily.   Yes [provider]  conjugated estrogens (PREMARIN) vaginal cream PLACE ONE-FOURTH (1/4) APPLICATORFUL VAGINALLY TWICE WEEKLY 07/10/18  Yes Conard Novak, MD  Turmeric (QC TUMERIC COMPLEX PO) Take by mouth.   Yes [provider]  vitamin B-12 (CYANOCOBALAMIN) 100 MCG tablet Take 100 mcg by mouth daily.   Yes [provider]   Allergies: No Known Allergies  Obstetric History: G1P1001  Social History   Socioeconomic History   Marital status: Married    Spouse name: Not on file   Number of children: Not on file   Years of education: Not on file   Highest education level: Not on file  Occupational History   Not on file  Tobacco Use   Smoking status: Never    Smokeless tobacco: Never  Vaping Use   Vaping Use: Never used  Substance and Sexual Activity   Alcohol use: Yes   Drug use: Never   Sexual activity: Yes  Other Topics Concern   Not on file  Social History Narrative   Not on file   Social Determinants of Health   Financial Resource Strain: Not on file  Food Insecurity: Not on file  Transportation Needs: Not on file  Physical Activity: Not on file  Stress: Not on file  Social Connections: Not on file  Intimate Partner Violence: Not on file    Family History  Problem Relation Age of Onset   Colon cancer Father    Hypertension Sister    Hypertension Maternal Grandmother    Liver cancer Maternal Grandmother    Breast cancer Neg Hx     Review of Systems  Constitutional: Negative.   HENT: Negative.    Eyes: Negative.   Respiratory: Negative.    Cardiovascular: Negative.   Gastrointestinal: Negative.   Genitourinary: Negative.   Musculoskeletal: Negative.   Skin: Negative.   Neurological: Negative.   Psychiatric/Behavioral: Negative.      Physical Exam Vitals: BP 126/80   Ht 5\' 3"  (1.6 m)   Wt 142 lb (64.4 kg)   BMI 25.15 kg/m   Physical Exam Constitutional:      General: She is not in acute distress.    Appearance: Normal appearance. She is well-developed.  Genitourinary:     Vulva and bladder normal.     No vaginal discharge, erythema, tenderness or bleeding.      Right Adnexa: not tender, not full and no mass present.    Left Adnexa: not tender, not full and no mass present.    No cervical motion tenderness, discharge, lesion or polyp.     Uterus is not enlarged or tender.     No uterine mass detected. Breasts:    Right: No inverted nipple, mass, nipple discharge, skin change or tenderness.     Left: No inverted nipple, mass, nipple discharge, skin change or tenderness.  HENT:     Head: Normocephalic and atraumatic.  Eyes:     General: No scleral icterus.    Conjunctiva/sclera: Conjunctivae normal.   Neck:     Thyroid: No thyromegaly.  Cardiovascular:     Rate and Rhythm: Normal rate and regular rhythm.     Heart sounds: No murmur heard.   No friction rub. No gallop.  Pulmonary:     Effort: Pulmonary effort is normal. No respiratory distress.     Breath sounds: Normal breath sounds. No wheezing or rales.  Abdominal:     General: Bowel sounds are normal. There is no distension.     Palpations: Abdomen is soft. There is no mass.     Tenderness: There is no abdominal tenderness. There is no guarding or rebound.  Musculoskeletal:        General: No swelling or tenderness. Normal range of motion.     Cervical back: Normal range of motion and neck supple.  Lymphadenopathy:     Cervical: No cervical adenopathy.     Lower Body: No right inguinal adenopathy. No left inguinal adenopathy.  Neurological:     General: No focal deficit present.     Mental Status: She is alert and oriented to person, place, and time.     Cranial Nerves: No cranial nerve deficit.  Skin:    General: Skin is warm and dry.     Findings: No erythema or rash.  Psychiatric:        Mood and Affect: Mood normal.        Behavior: Behavior normal.        Judgment: Judgment normal.   Female chaperone present for pelvic and breast  portions of the physical exam  Results: AUDIT Questionnaire (screen for alcoholism): 0 PHQ-9: 0  Assessment and Plan:  60 y.o. G9P1001 female here for routine annual gynecologic examination  Plan: Problem List Items Addressed This Visit   None Screening: -- Blood pressure screen normal -- Colonoscopy - not due -- Mammogram - due. Patient to call [facility] to arrange. She understands that it is her responsibility to arrange this. -- Weight screening: normal -- Depression screening negative (PHQ-9) -- Nutrition: normal -- cholesterol screening: per PCP -- osteoporosis screening: not due -- tobacco screening: not using -- alcohol screening: AUDIT questionnaire indicates low-risk  usage. -- family history of breast cancer screening: done. not at high risk. -- no evidence of domestic violence or intimate partner violence. -- STD screening: gonorrhea/chlamydia NAAT not collected per patient request. -- pap smear not collected per ASCCP guidelines -- flu vaccine: does not want one.  -- HPV vaccination series: not eligilbe   -- Has not received COVID19 vaccine  Thomasene Mohair, MD 03/02/2021 1:27 PM

## 2021-03-03 ENCOUNTER — Other Ambulatory Visit: Payer: Self-pay | Admitting: Nurse Practitioner

## 2021-03-03 ENCOUNTER — Other Ambulatory Visit: Payer: Self-pay | Admitting: Obstetrics and Gynecology

## 2021-03-03 DIAGNOSIS — Z1231 Encounter for screening mammogram for malignant neoplasm of breast: Secondary | ICD-10-CM

## 2021-03-04 ENCOUNTER — Other Ambulatory Visit: Payer: Self-pay

## 2021-03-04 DIAGNOSIS — N952 Postmenopausal atrophic vaginitis: Secondary | ICD-10-CM

## 2021-03-04 DIAGNOSIS — Z01419 Encounter for gynecological examination (general) (routine) without abnormal findings: Secondary | ICD-10-CM

## 2021-03-04 MED ORDER — ESTRADIOL 0.1 MG/GM VA CREA: 1.0000 | TOPICAL_CREAM | VAGINAL | 4 refills | Status: AC

## 2021-03-22 ENCOUNTER — Other Ambulatory Visit: Payer: Self-pay

## 2021-03-22 ENCOUNTER — Ambulatory Visit
Admission: RE | Admit: 2021-03-22 | Discharge: 2021-03-22 | Disposition: A | Discharge status: Home / Self Care | Source: Ambulatory Visit | Attending: Obstetrics and Gynecology | Admitting: Obstetrics and Gynecology

## 2021-03-22 DIAGNOSIS — Z1231 Encounter for screening mammogram for malignant neoplasm of breast: Secondary | ICD-10-CM | POA: Diagnosis present

## 2022-04-05 ENCOUNTER — Other Ambulatory Visit: Payer: Self-pay | Admitting: Obstetrics and Gynecology

## 2022-04-05 DIAGNOSIS — Z1231 Encounter for screening mammogram for malignant neoplasm of breast: Secondary | ICD-10-CM

## 2022-04-12 ENCOUNTER — Ambulatory Visit
Admission: RE | Admit: 2022-04-12 | Discharge: 2022-04-12 | Disposition: A | Discharge status: Home / Self Care | Source: Ambulatory Visit | Attending: Obstetrics and Gynecology | Admitting: Obstetrics and Gynecology

## 2022-04-12 DIAGNOSIS — Z1231 Encounter for screening mammogram for malignant neoplasm of breast: Secondary | ICD-10-CM | POA: Insufficient documentation

## 2022-04-16 IMAGING — MG MM DIGITAL SCREENING BILAT W/ TOMO AND CAD
8 series · 8 of 24 positions shown · non-contrast
Comparison: Previous exam(s).

CLINICAL DATA: Screening.

EXAM:
DIGITAL SCREENING BILATERAL MAMMOGRAM WITH TOMOSYNTHESIS AND CAD
TECHNIQUE: Bilateral screening digital craniocaudal and mediolateral oblique
mammograms were obtained. Bilateral screening digital breast
tomosynthesis was performed. The images were evaluated with
computer-aided detection.

[L CC synth-2D]
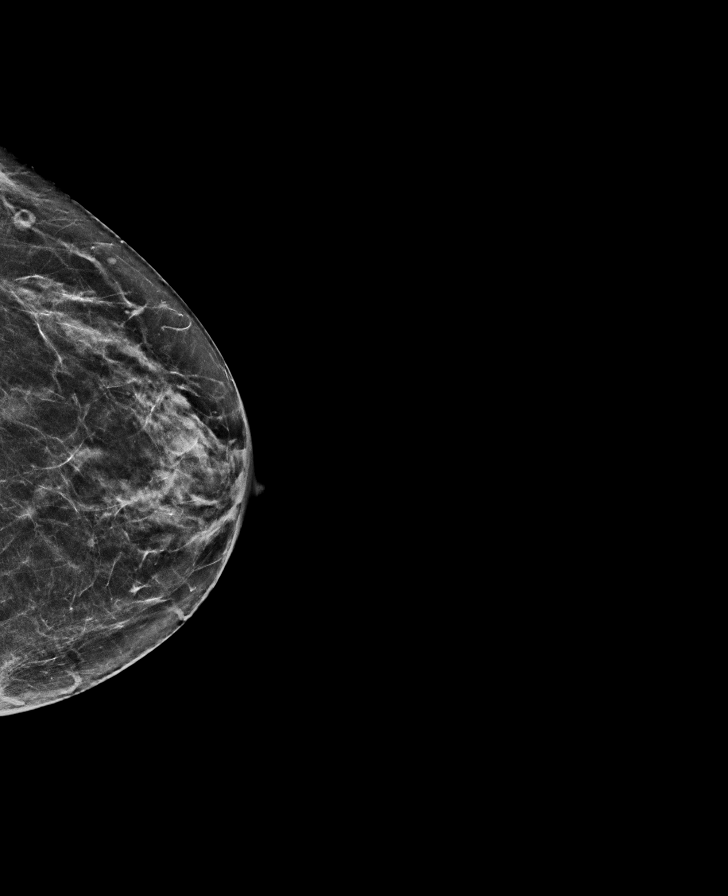

[R MLO synth-2D]
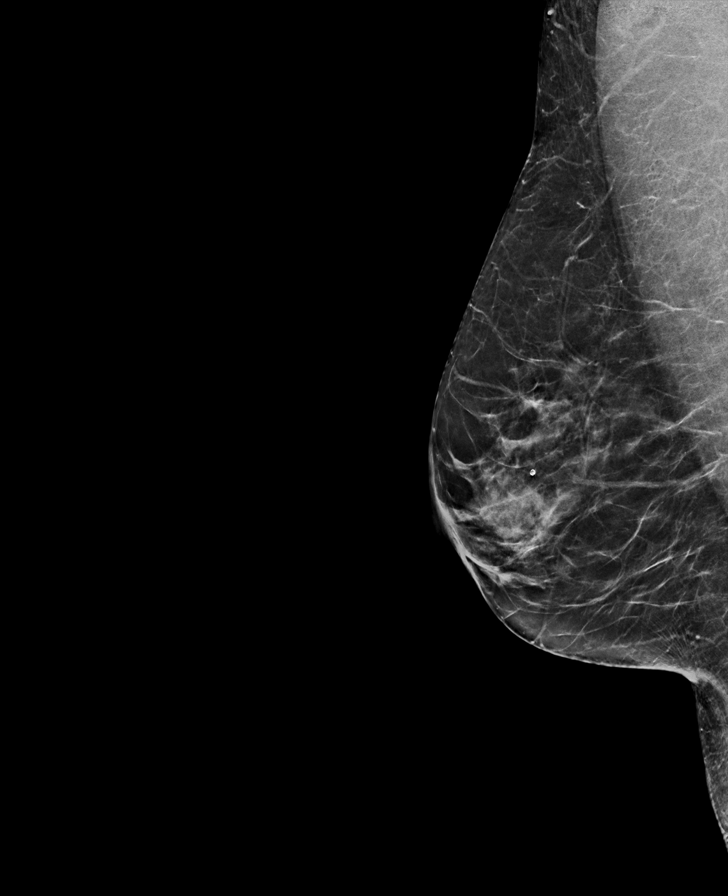

[L MLO synth-2D]
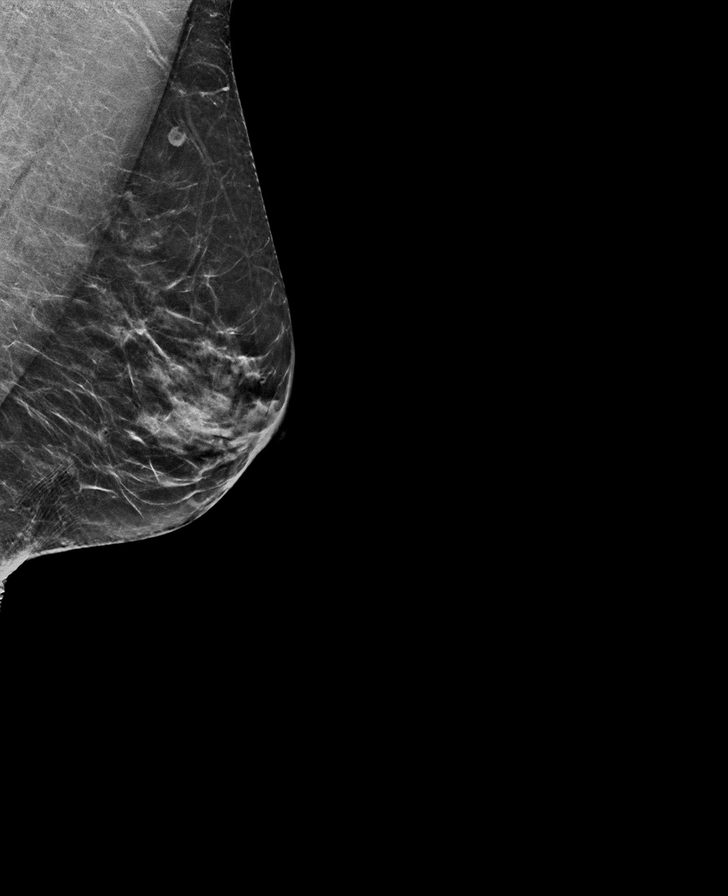

[R CC synth-2D]
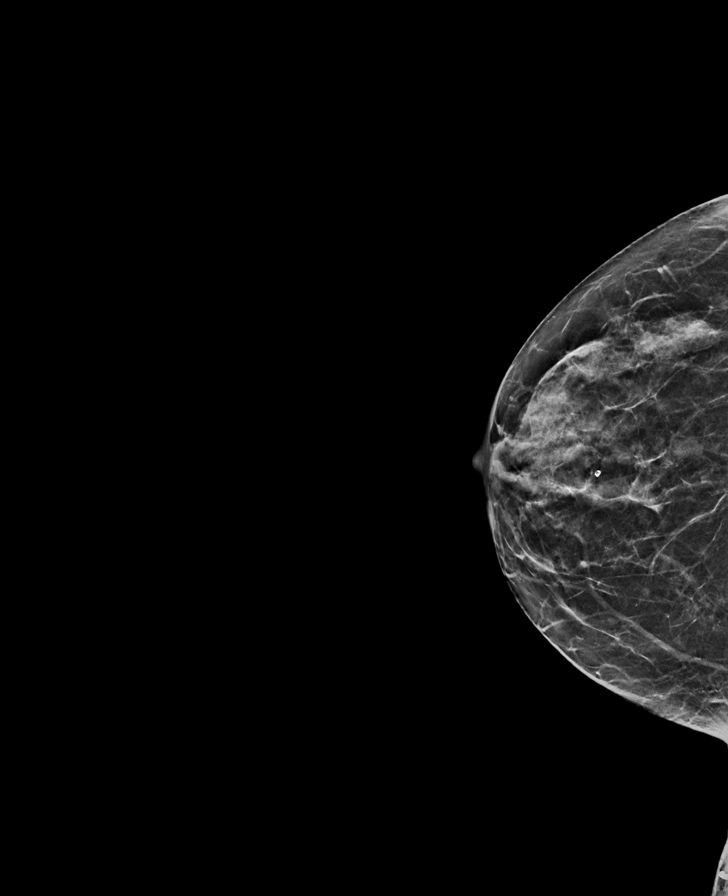

[L MLO tomo · tomo slice 29/57.0]
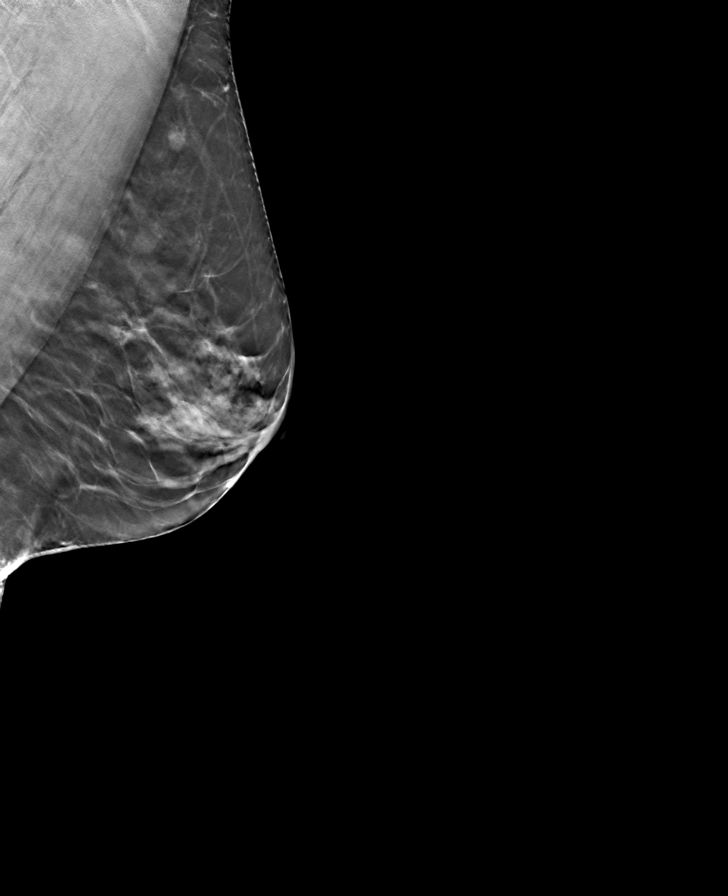

[R MLO tomo · tomo slice 29/58.0]
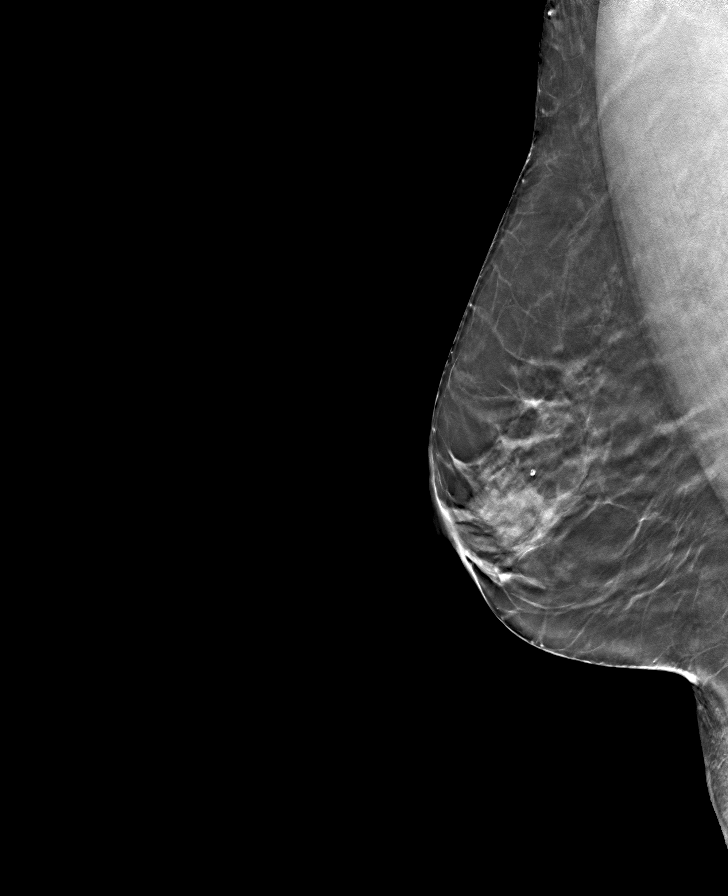

[L CC tomo · tomo slice 27/54.0]
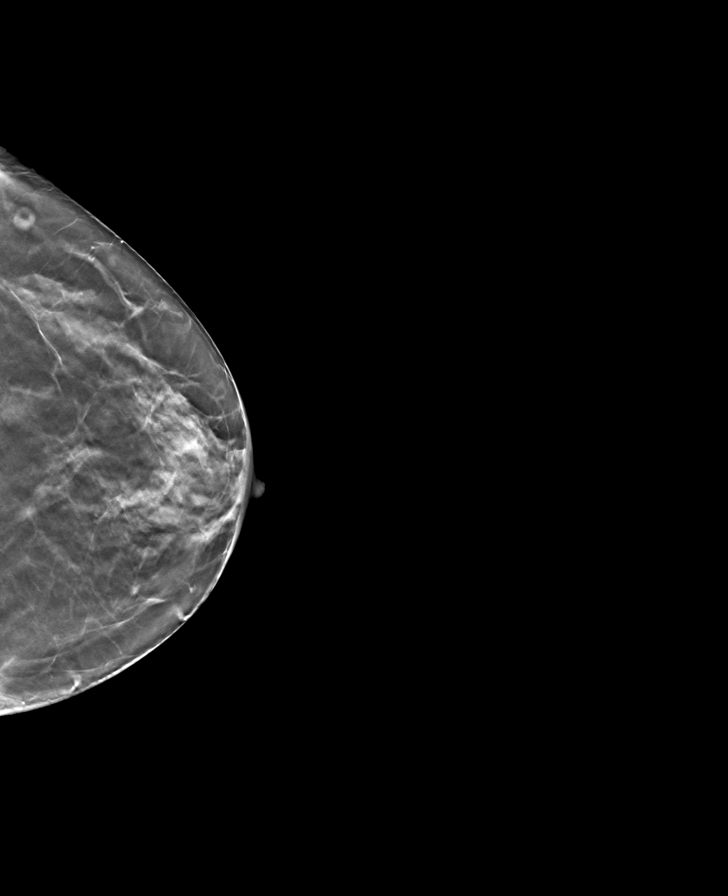

[R CC tomo · tomo slice 25/49.0]
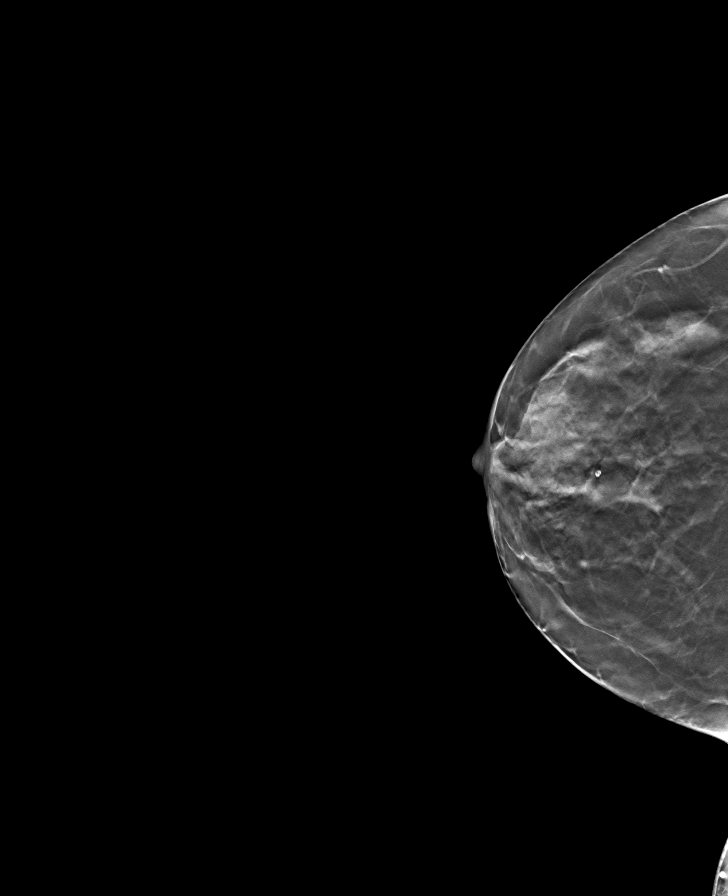

[8 of 24 positions shown; findings below may reference images not displayed]

ACR Breast Density Category c: The breast tissue is heterogeneously
dense, which may obscure small masses.
FINDINGS: There are no findings suspicious for malignancy.
IMPRESSION: No mammographic evidence of malignancy. A result letter of this
screening mammogram will be mailed directly to the patient.

RECOMMENDATION:
Screening mammogram in one year. (Code:Q3-W-BC3)

BI-RADS CATEGORY  1: Negative.
